# Patient Record
Sex: Male | Born: 1990 | Hispanic: Yes | Marital: Single | Smoking: Current every day smoker
Health system: Southern US, Community
[De-identification: ages and names within clinical notes are randomized; demographics above are authoritative.]

---

## 2016-05-13 ENCOUNTER — Emergency Department (HOSPITAL_COMMUNITY): Payer: Self-pay

## 2016-05-13 ENCOUNTER — Encounter (HOSPITAL_COMMUNITY): Payer: Self-pay | Admitting: *Deleted

## 2016-05-13 ENCOUNTER — Emergency Department (HOSPITAL_COMMUNITY)
Admission: EM | Admit: 2016-05-13 | Discharge: 2016-05-13 | Disposition: A | Discharge status: Home / Self Care | Payer: Self-pay | Attending: Emergency Medicine | Admitting: Emergency Medicine

## 2016-05-13 DIAGNOSIS — Y999 Unspecified external cause status: Secondary | ICD-10-CM | POA: Insufficient documentation

## 2016-05-13 DIAGNOSIS — W001XXA Fall from stairs and steps due to ice and snow, initial encounter: Secondary | ICD-10-CM | POA: Insufficient documentation

## 2016-05-13 DIAGNOSIS — Y939 Activity, unspecified: Secondary | ICD-10-CM | POA: Insufficient documentation

## 2016-05-13 DIAGNOSIS — F17210 Nicotine dependence, cigarettes, uncomplicated: Secondary | ICD-10-CM | POA: Insufficient documentation

## 2016-05-13 DIAGNOSIS — Y929 Unspecified place or not applicable: Secondary | ICD-10-CM | POA: Insufficient documentation

## 2016-05-13 DIAGNOSIS — S61210A Laceration without foreign body of right index finger without damage to nail, initial encounter: Secondary | ICD-10-CM | POA: Insufficient documentation

## 2016-05-13 DIAGNOSIS — S61411A Laceration without foreign body of right hand, initial encounter: Secondary | ICD-10-CM

## 2016-05-13 MED ORDER — LIDOCAINE HCL 2 % IJ SOLN
20.0000 mL | Freq: Once | INTRAMUSCULAR | Status: AC
  Administered 2016-05-13: 400 mg
  Filled 2016-05-13: qty 20

## 2016-05-13 NOTE — ED Provider Notes (Signed)
[facility]-EMERGENCY DEPT Provider Note   CSN: [id number] Arrival date & time: 05/13/16  1616  History   Chief Complaint Chief Complaint  Patient presents with  . Fall  . Hand Pain    HPI Randall Stevens is a 26 y.o. male.  HPI   Patient presents to the emergency department with no significant past medical history for evaluation of an injury to his right hand. He accidentally slipped on the stairs due to ice sustaining a laceration to his right index finger. He says he is able to move his hand. He denies hitting his head, injuring his neck or loss of consciousness. He denies having any other injuries. He states that he is up-to-date on his tetanus.  History reviewed. No pertinent past medical history.  There are no active problems to display for this patient.   History reviewed. No pertinent surgical history.     Home Medications    Prior to Admission medications   Not on File    Family History No family history on file.  Social History Social History  Substance Use Topics  . Smoking status: Current Every Day Smoker    Packs/day: 0.50    Types: Cigarettes  . Smokeless tobacco: Never Used  . Alcohol use Yes     Comment: sometimes     Allergies   Patient has no known allergies.   Review of Systems Review of Systems Review of Systems All other systems negative except as documented in the HPI. All pertinent positives and negatives as reviewed in the HPI.   Physical Exam Updated Vital Signs BP 105/65 (BP Location: Left Arm)   Pulse 83   Temp 98.3 F (36.8 C) (Oral)   Resp 16   SpO2 96%   Physical Exam  Constitutional: He appears well-developed and well-nourished. No distress.  HENT:  Head: Normocephalic and atraumatic.  Eyes: Pupils are equal, round, and reactive to light.  Neck: Normal range of motion. Neck supple.  Cardiovascular: Normal rate and regular rhythm.   Pulmonary/Chest: Effort normal.  Abdominal: Soft.  Musculoskeletal:       Right  hand: He exhibits laceration.       Hands: Neurological: He is alert.  Skin: Skin is warm and dry.  Nursing note and vitals reviewed.  ED Treatments / Results  Labs (all labs ordered are listed, but only abnormal results are displayed) Labs Reviewed - No data to display  EKG  EKG Interpretation None       Radiology Dg Hand Complete Right  Result Date: 05/13/2016 CLINICAL DATA:  Fall yesterday, right hand pain. EXAM: RIGHT HAND - COMPLETE 3+ VIEW COMPARISON:  None. FINDINGS: There is no evidence of fracture or dislocation. There is no evidence of arthropathy or other focal bone abnormality. Soft tissues are unremarkable.   IMPRESSION: Negative. Electronically Signed   By: Bary RichardStan  Maynard M.D.   On: 05/13/2016 17:55    Procedures Procedures (including critical care time)  Medications Ordered in ED Medications  lidocaine (XYLOCAINE) 2 % (with pres) injection 400 mg (400 mg Other Given 05/13/16 1907)     Initial Impression / Assessment and Plan / ED Course  I have reviewed the triage vital signs and the nursing notes.  Pertinent labs & imaging results that were available during my care of the patient were reviewed by me and considered in my medical decision making (see chart for details).    LACERATION REPAIR Performed by: Dorthula MatasGREENE,Herberta Pickron G Authorized by: Dorthula MatasGREENE,Brenetta Penny G Consent: Verbal consent obtained. Risks and  benefits: risks, benefits and alternatives were discussed Consent given by: patient Patient identity confirmed: provided demographic data Prepped and Draped in normal sterile fashion Wound explored  Laceration Location: right anterior index finger  Laceration Length: 1.5 cm  No Foreign Bodies seen or palpated  Anesthesia: local infiltration  Local anesthetic: lidocaine 2% wo epinephrine  Anesthetic total: 5 ml  Irrigation method: syringe Amount of cleaning: standard  Skin closure: sutures  Number of sutures: 5  Technique: simple  interrupted  Patient tolerance: Patient tolerated the procedure well with no immediate complications.  Tetanus UTD. Laceration occurred < 12 hours prior to repair. Discussed laceration care with pt and answered questions. Pt to f-u for suture removal in 14 days and wound check sooner should there be signs of dehiscence or infection. Pt is hemodynamically stable with no complaints prior to dc.    Final Clinical Impressions(s) / ED Diagnoses   Final diagnoses:  Laceration of right hand, foreign body presence unspecified, initial encounter    New Prescriptions New Prescriptions   No medications on file     Marlon Pel, PA-C 05/13/16 1936    Melene Plan, DO 05/13/16 2318

## 2016-05-13 NOTE — ED Triage Notes (Signed)
Information obtained via an interpreter.  Pt speaks spanish. Pt reports slid on the stairs d/t ice and fell on his right hand.  Pt reports full range of motion to right hand.  Bleeding noted to the right hand.  Pt denies any pain anywhere else.

## 2017-11-20 IMAGING — CR DG HAND COMPLETE 3+V*R*
3 series · 3 of 3 positions shown · non-contrast
Comparison: None.

CLINICAL DATA: Fall yesterday, right hand pain.

EXAM:
RIGHT HAND - COMPLETE 3+ VIEW

[x hand pa right]
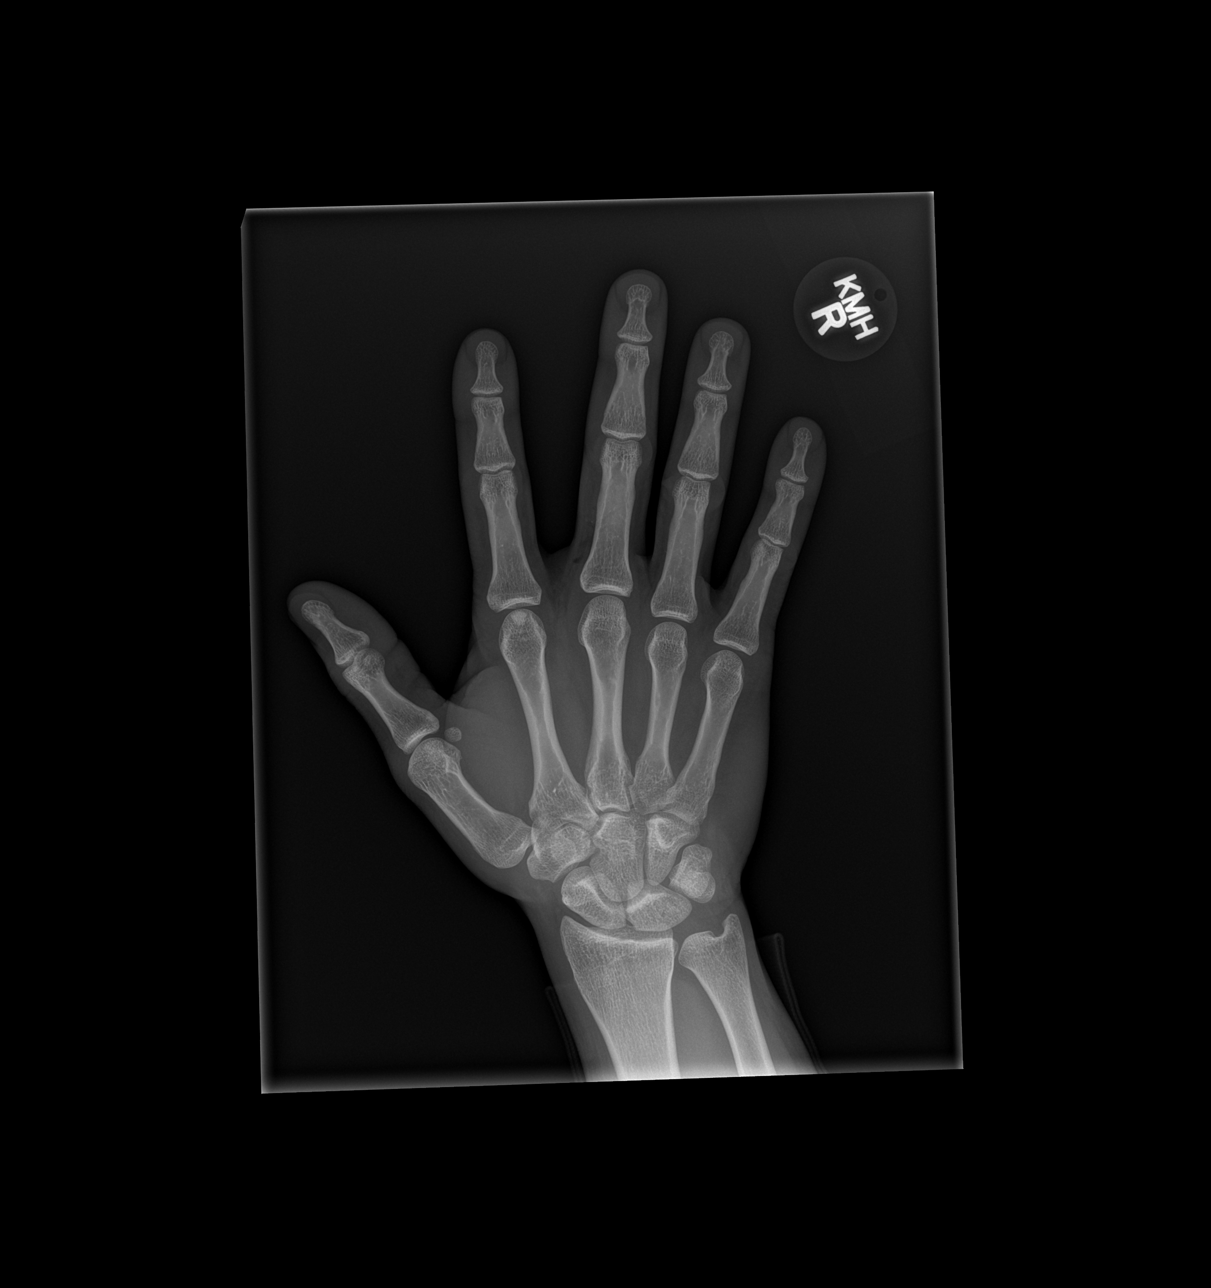

[x hand obl right]
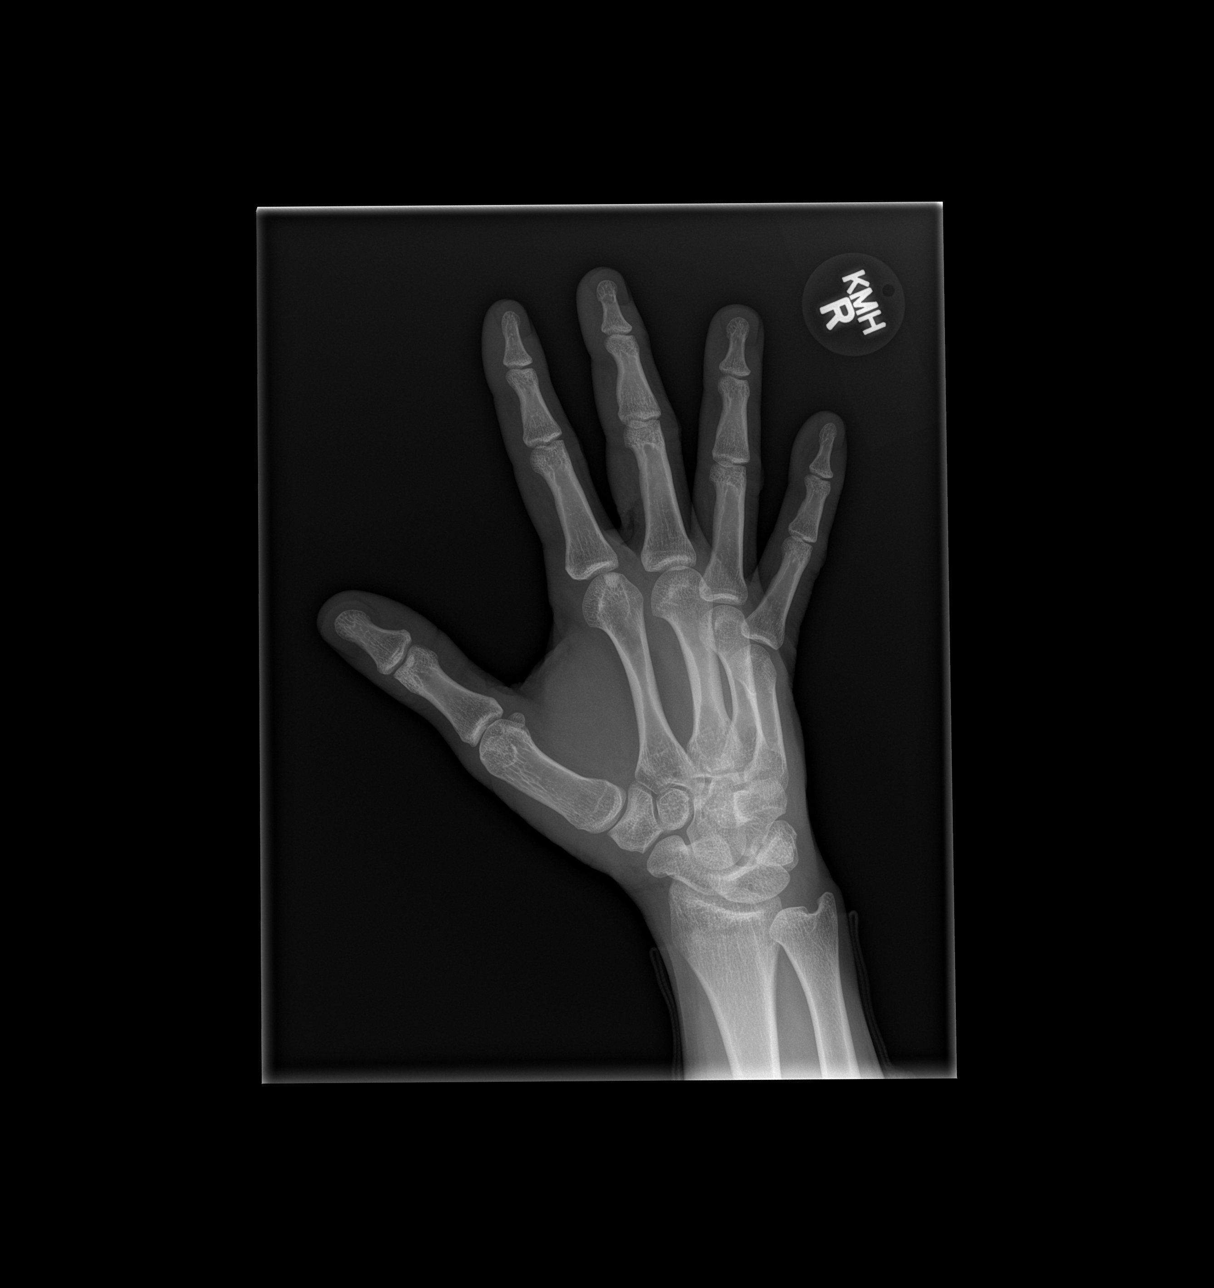

[x hand lat right]
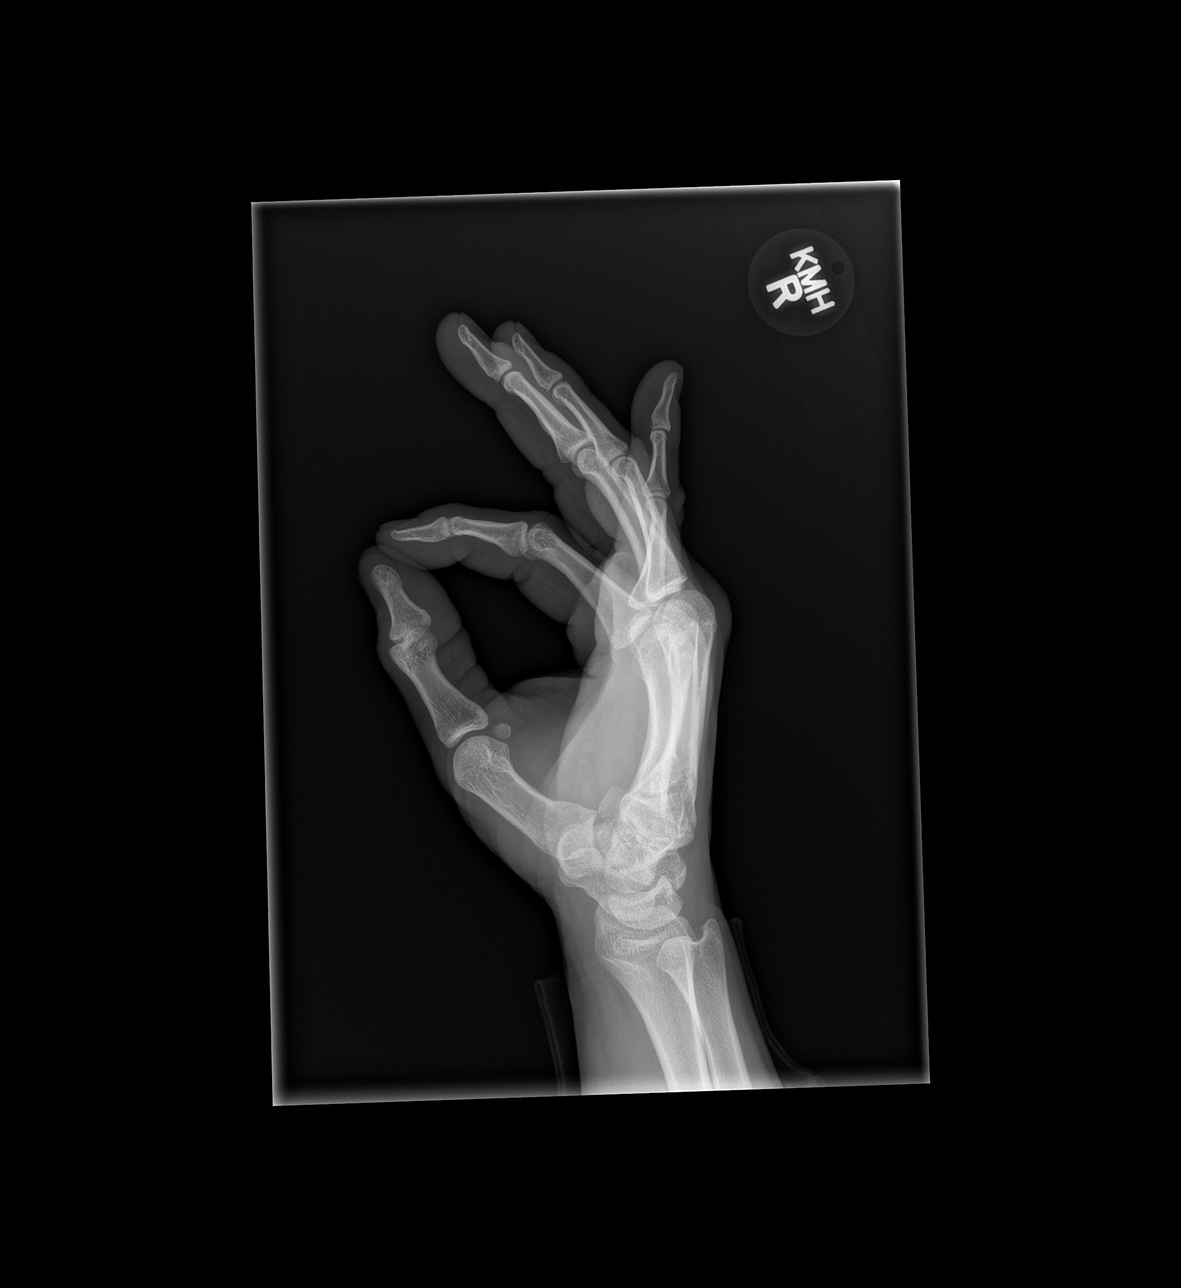

[3 of 3 positions shown; findings below may reference images not displayed]

FINDINGS: There is no evidence of fracture or dislocation. There is no
evidence of arthropathy or other focal bone abnormality. Soft
tissues are unremarkable.
IMPRESSION: Negative.
